# Patient Record
Sex: Male | Born: 1975 | Race: White | Hispanic: No | Marital: Married | Smoking: Current every day smoker
Health system: Southern US, Community
[De-identification: ages and names within clinical notes are randomized; demographics above are authoritative.]

## PROBLEM LIST (undated history)

## (undated) DIAGNOSIS — M40209 Unspecified kyphosis, site unspecified: Secondary | ICD-10-CM

## (undated) DIAGNOSIS — G629 Polyneuropathy, unspecified: Secondary | ICD-10-CM

## (undated) DIAGNOSIS — M419 Scoliosis, unspecified: Secondary | ICD-10-CM

## (undated) HISTORY — PX: EYE SURGERY: SHX253

## (undated) HISTORY — PX: MANDIBLE RECONSTRUCTION: SHX431

---

## 2015-06-25 ENCOUNTER — Ambulatory Visit
Admission: EM | Admit: 2015-06-25 | Discharge: 2015-06-25 | Disposition: A | Discharge status: Home / Self Care | Payer: Medicaid Other | Attending: Family Medicine | Admitting: Family Medicine

## 2015-06-25 ENCOUNTER — Ambulatory Visit: Payer: Medicaid Other

## 2015-06-25 DIAGNOSIS — R51 Headache: Secondary | ICD-10-CM | POA: Diagnosis present

## 2015-06-25 DIAGNOSIS — S61432A Puncture wound without foreign body of left hand, initial encounter: Secondary | ICD-10-CM | POA: Insufficient documentation

## 2015-06-25 DIAGNOSIS — S0990XA Unspecified injury of head, initial encounter: Secondary | ICD-10-CM

## 2015-06-25 DIAGNOSIS — Y29XXXA Contact with blunt object, undetermined intent, initial encounter: Secondary | ICD-10-CM | POA: Insufficient documentation

## 2015-06-25 DIAGNOSIS — F17210 Nicotine dependence, cigarettes, uncomplicated: Secondary | ICD-10-CM | POA: Diagnosis not present

## 2015-06-25 DIAGNOSIS — W294XXA Contact with nail gun, initial encounter: Secondary | ICD-10-CM | POA: Diagnosis not present

## 2015-06-25 DIAGNOSIS — M79642 Pain in left hand: Secondary | ICD-10-CM | POA: Diagnosis present

## 2015-06-25 HISTORY — DX: Unspecified kyphosis, site unspecified: M40.209

## 2015-06-25 HISTORY — DX: Scoliosis, unspecified: M41.9

## 2015-06-25 HISTORY — DX: Polyneuropathy, unspecified: G62.9

## 2015-06-25 MED ORDER — OXYCODONE-ACETAMINOPHEN 5-325 MG PO TABS: 1.0000 | ORAL_TABLET | Freq: Four times a day (QID) | ORAL | Status: DC | PRN

## 2015-06-25 MED ORDER — AMOXICILLIN-POT CLAVULANATE 875-125 MG PO TABS: 1.0000 | ORAL_TABLET | Freq: Two times a day (BID) | ORAL | Status: DC

## 2015-06-25 MED ORDER — TETANUS-DIPHTH-ACELL PERTUSSIS 5-2.5-18.5 LF-MCG/0.5 IM SUSP
0.5000 mL | Freq: Once | INTRAMUSCULAR | Status: AC
  Administered 2015-06-25: 0.5 mL via INTRAMUSCULAR
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
## 2020-06-18 ENCOUNTER — Emergency Department
Admission: EM | Admit: 2020-06-18 | Discharge: 2020-06-23 | Disposition: A | Discharge status: Home / Self Care | Payer: Medicaid Other | Attending: Emergency Medicine | Admitting: Emergency Medicine

## 2020-06-18 ENCOUNTER — Other Ambulatory Visit: Payer: Self-pay

## 2020-06-18 DIAGNOSIS — M79661 Pain in right lower leg: Secondary | ICD-10-CM | POA: Diagnosis not present

## 2020-06-18 DIAGNOSIS — Y929 Unspecified place or not applicable: Secondary | ICD-10-CM | POA: Insufficient documentation

## 2020-06-18 DIAGNOSIS — M79662 Pain in left lower leg: Secondary | ICD-10-CM | POA: Diagnosis not present

## 2020-06-18 DIAGNOSIS — G8929 Other chronic pain: Secondary | ICD-10-CM

## 2020-06-18 DIAGNOSIS — Y939 Activity, unspecified: Secondary | ICD-10-CM | POA: Diagnosis not present

## 2020-06-18 DIAGNOSIS — Z20822 Contact with and (suspected) exposure to covid-19: Secondary | ICD-10-CM | POA: Diagnosis not present

## 2020-06-18 DIAGNOSIS — S14104S Unspecified injury at C4 level of cervical spinal cord, sequela: Secondary | ICD-10-CM | POA: Diagnosis not present

## 2020-06-18 DIAGNOSIS — F17210 Nicotine dependence, cigarettes, uncomplicated: Secondary | ICD-10-CM | POA: Insufficient documentation

## 2020-06-18 DIAGNOSIS — X58XXXA Exposure to other specified factors, initial encounter: Secondary | ICD-10-CM | POA: Diagnosis not present

## 2020-06-18 DIAGNOSIS — Y999 Unspecified external cause status: Secondary | ICD-10-CM | POA: Diagnosis not present

## 2020-06-18 DIAGNOSIS — S14101S Unspecified injury at C1 level of cervical spinal cord, sequela: Secondary | ICD-10-CM

## 2020-06-18 LAB — CBC WITH DIFFERENTIAL/PLATELET
Abs Immature Granulocytes: 0.05 10*3/uL (ref 0.00–0.07)
Basophils Absolute: 0.1 10*3/uL (ref 0.0–0.1)
Basophils Relative: 1 %
Eosinophils Absolute: 0.1 10*3/uL (ref 0.0–0.5)
Eosinophils Relative: 1 %
HCT: 44.7 % (ref 39.0–52.0)
Hemoglobin: 15.1 g/dL (ref 13.0–17.0)
Immature Granulocytes: 1 %
Lymphocytes Relative: 17 %
Lymphs Abs: 1.6 10*3/uL (ref 0.7–4.0)
MCH: 27.3 pg (ref 26.0–34.0)
MCHC: 33.8 g/dL (ref 30.0–36.0)
MCV: 80.7 fL (ref 80.0–100.0)
Monocytes Absolute: 0.8 10*3/uL (ref 0.1–1.0)
Monocytes Relative: 9 %
Neutro Abs: 7.1 10*3/uL (ref 1.7–7.7)
Neutrophils Relative %: 71 %
Platelets: 447 10*3/uL — ABNORMAL HIGH (ref 150–400)
RBC: 5.54 MIL/uL (ref 4.22–5.81)
RDW: 13.7 % (ref 11.5–15.5)
WBC: 9.7 10*3/uL (ref 4.0–10.5)
nRBC: 0 % (ref 0.0–0.2)

## 2020-06-18 LAB — BASIC METABOLIC PANEL
Anion gap: 10 (ref 5–15)
BUN: 9 mg/dL (ref 6–20)
CO2: 29 mmol/L (ref 22–32)
Calcium: 9.1 mg/dL (ref 8.9–10.3)
Chloride: 100 mmol/L (ref 98–111)
Creatinine, Ser: 1.14 mg/dL (ref 0.61–1.24)
GFR calc Af Amer: >60 mL/min (ref >60)
GFR calc non Af Amer: >60 mL/min (ref >60)
Glucose, Bld: 91 mg/dL (ref 70–99)
Potassium: 3.3 mmol/L — ABNORMAL LOW (ref 3.5–5.1)
Sodium: 139 mmol/L (ref 135–145)

## 2020-06-18 MED ORDER — BACLOFEN 10 MG PO TABS
20.0000 mg | ORAL_TABLET | Freq: Three times a day (TID) | ORAL | Status: DC
  Administered 2020-06-18 – 2020-06-23 (×15): 20 mg via ORAL
  Filled 2020-06-18 (×18): qty 2

## 2020-06-18 MED ORDER — GABAPENTIN 300 MG PO CAPS: 300.0000 mg | ORAL_CAPSULE | Freq: Three times a day (TID) | ORAL | 1 refills | Status: DC

## 2020-06-18 MED ORDER — BACLOFEN 20 MG PO TABS: 20.0000 mg | ORAL_TABLET | Freq: Three times a day (TID) | ORAL | 1 refills | Status: DC

## 2020-06-18 MED ORDER — NORTRIPTYLINE HCL 25 MG PO CAPS: 25.0000 mg | ORAL_CAPSULE | Freq: Every day | ORAL | 0 refills | Status: DC

## 2020-06-18 MED ORDER — GABAPENTIN 300 MG PO CAPS
300.0000 mg | ORAL_CAPSULE | Freq: Three times a day (TID) | ORAL | Status: DC
  Administered 2020-06-18 – 2020-06-23 (×15): 300 mg via ORAL
  Filled 2020-06-18 (×15): qty 1

## 2020-06-18 MED ORDER — DIAZEPAM 2 MG PO TABS: 2.0000 mg | ORAL_TABLET | Freq: Two times a day (BID) | ORAL | 0 refills | Status: DC | PRN

## 2020-06-18 MED ORDER — DIAZEPAM 2 MG PO TABS
2.0000 mg | ORAL_TABLET | Freq: Two times a day (BID) | ORAL | Status: DC | PRN
  Administered 2020-06-18 – 2020-06-23 (×10): 2 mg via ORAL
  Filled 2020-06-18 (×12): qty 1

## 2020-06-18 MED ORDER — NORTRIPTYLINE HCL 25 MG PO CAPS
25.0000 mg | ORAL_CAPSULE | Freq: Every day | ORAL | Status: DC
  Administered 2020-06-18 – 2020-06-22 (×5): 25 mg via ORAL
  Filled 2020-06-18 (×6): qty 1
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
## 2020-06-18 NOTE — ED Notes (Signed)
Pt given meal tray. Denies further needs at this time

## 2020-06-19 DIAGNOSIS — S14104S Unspecified injury at C4 level of cervical spinal cord, sequela: Secondary | ICD-10-CM | POA: Diagnosis not present

## 2020-06-19 NOTE — ED Notes (Signed)
Called dietary for tray.
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
## 2020-06-19 NOTE — ED Notes (Signed)
Soc work placement

## 2020-06-19 NOTE — ED Notes (Signed)
Resumed care from Mac, RN. Pt resting comfortably, requested cream and sugar for coffee, which was provided. No other needs expressed at this time. NAD noted.

## 2020-06-19 NOTE — ED Notes (Signed)
Pt requesting food; diet order entered.

## 2020-06-20 DIAGNOSIS — S14104S Unspecified injury at C4 level of cervical spinal cord, sequela: Secondary | ICD-10-CM | POA: Diagnosis not present

## 2020-06-20 LAB — URINE DRUG SCREEN, QUALITATIVE (ARMC ONLY)
Amphetamines, Ur Screen: NOT DETECTED
Barbiturates, Ur Screen: NOT DETECTED
Benzodiazepine, Ur Scrn: POSITIVE — AB
Cannabinoid 50 Ng, Ur ~~LOC~~: NOT DETECTED
Cocaine Metabolite,Ur ~~LOC~~: POSITIVE — AB
MDMA (Ecstasy)Ur Screen: NOT DETECTED
Methadone Scn, Ur: NOT DETECTED
Opiate, Ur Screen: NOT DETECTED
Phencyclidine (PCP) Ur S: NOT DETECTED
Tricyclic, Ur Screen: POSITIVE — AB

## 2020-06-20 LAB — URINALYSIS, COMPLETE (UACMP) WITH MICROSCOPIC
Bacteria, UA: NONE SEEN
Bilirubin Urine: NEGATIVE
Glucose, UA: NEGATIVE mg/dL
Ketones, ur: NEGATIVE mg/dL
Leukocytes,Ua: NEGATIVE
Nitrite: NEGATIVE
Protein, ur: NEGATIVE mg/dL
Specific Gravity, Urine: 1.009 (ref 1.005–1.030)
Squamous Epithelial / LPF: NONE SEEN (ref 0–5)
pH: 6 (ref 5.0–8.0)

## 2020-06-20 MED ORDER — LIDOCAINE VISCOUS HCL 2 % MT SOLN
15.0000 mL | Freq: Once | OROMUCOSAL | Status: AC
  Administered 2020-06-20: 15 mL via ORAL
  Filled 2020-06-20: qty 15

## 2020-06-20 MED ORDER — DIAZEPAM 5 MG PO TABS
5.0000 mg | ORAL_TABLET | Freq: Once | ORAL | Status: AC
  Administered 2020-06-20: 5 mg via ORAL
  Filled 2020-06-20: qty 1

## 2020-06-20 MED ORDER — DICYCLOMINE HCL 10 MG PO CAPS
20.0000 mg | ORAL_CAPSULE | Freq: Once | ORAL | Status: AC
  Administered 2020-06-20: 20 mg via ORAL
  Filled 2020-06-20: qty 2

## 2020-06-20 MED ORDER — NICOTINE 21 MG/24HR TD PT24
21.0000 mg | MEDICATED_PATCH | Freq: Once | TRANSDERMAL | Status: AC
  Administered 2020-06-20: 21 mg via TRANSDERMAL
  Filled 2020-06-20: qty 1

## 2020-06-20 MED ORDER — ALUM & MAG HYDROXIDE-SIMETH 200-200-20 MG/5ML PO SUSP
30.0000 mL | Freq: Once | ORAL | Status: AC
  Administered 2020-06-20: 30 mL via ORAL
  Filled 2020-06-20: qty 30

## 2020-06-20 NOTE — ED Notes (Signed)
Pt placed in hospital bed with no issue. 
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
## 2020-06-20 NOTE — TOC Progression Note (Signed)
Transition of Care [facility]) - Progression Note    Patient Details  Name: Cole Poole MRN: [id number] Date of Birth: May 20, 1976  Transition of Care [facility]) CM/SW Contact  [place] Cellar, RN Phone Number: 06/20/2020, 11:31 AM  Clinical Narrative:    Received call from Jimmie Molly, APS DSS [phone] states she has been long term Child psychotherapist for this patient in outpatient setting. Roni states patient was requesting placement into LTC facility.         Expected Discharge Plan and Services                                                 Social Determinants of Health (SDOH) Interventions    Readmission Risk Interventions No flowsheet data found.

## 2020-06-20 NOTE — TOC Progression Note (Signed)
Transition of Care [facility]) - Progression Note    Patient Details  Name: Cole Poole MRN: [id number] Date of Birth: 06/30/1976  Transition of Care [facility]) CM/SW Contact  [place] Cellar, RN Phone Number: 06/20/2020, 10:46 AM  Clinical Narrative:    Received call from Jimmie Molly, [employer] DSS [phone] requesting updated information.         Expected Discharge Plan and Services                                                 Social Determinants of Health (SDOH) Interventions    Readmission Risk Interventions No flowsheet data found.
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
## 2020-06-20 NOTE — ED Notes (Signed)
VOL  PENDING  PLACEMENT 

## 2020-06-20 NOTE — NC FL2 (Signed)
[place] MEDICAID FL2 LEVEL OF CARE SCREENING TOOL     IDENTIFICATION  Patient Name: Cole Poole Birthdate: 01-Feb-1976 Sex: male Admission Date (Current Location): 06/18/2020  [place] and [place] Number:  Chiropodist and Address:  [facility], [address], [place], [place] [address]      Provider Number: [id number]  Attending Physician Name and Address:  No att. providers found  Relative Name and Phone Number:       Current Level of Care: Hospital Recommended Level of Care: Other (Comment) (Long Term Care Placement) Prior Approval Number:    Date Approved/Denied:   PASRR Number:    Discharge Plan: Other (Comment) (Long Term Care placement)    Current Diagnoses: There are no problems to display for this patient.   Orientation RESPIRATION BLADDER Height & Weight     Self, Time, Situation, Place  Normal   Weight: 93 kg Height:  5\' 11"  (180.3 cm)  BEHAVIORAL SYMPTOMS/MOOD NEUROLOGICAL BOWEL NUTRITION STATUS        Diet  AMBULATORY STATUS COMMUNICATION OF NEEDS Skin   Total Care (Paraplegic) Verbally Normal                       Personal Care Assistance Level of Assistance  Bathing, Dressing, Feeding Bathing Assistance: Limited assistance Feeding assistance: Limited assistance Dressing Assistance: Limited assistance     Functional Limitations Info             SPECIAL CARE FACTORS FREQUENCY                       Contractures      Additional Factors Info                  Current Medications (06/20/2020):  This is the current hospital active medication list Current Facility-Administered Medications  Medication Dose Route Frequency Provider Last Rate Last Admin  . baclofen (LIORESAL) tablet 20 mg  20 mg Oral TID 08/20/2020, MD   20 mg at 06/20/20 0802  . diazepam (VALIUM) tablet 2 mg  2 mg Oral Q12H PRN 08/20/20, MD   2 mg at 06/20/20 0802  . gabapentin (NEURONTIN) capsule 300 mg  300 mg  Oral TID 08/20/20, MD   300 mg at 06/20/20 0802  . nicotine (NICODERM CQ - dosed in mg/24 hours) patch 21 mg  21 mg Transdermal Once 08/20/20, MD   21 mg at 06/20/20 0522  . nortriptyline (PAMELOR) capsule 25 mg  25 mg Oral QHS 08/20/20, MD   25 mg at 06/19/20 2201   Current Outpatient Medications  Medication Sig Dispense Refill  . amoxicillin-clavulanate (AUGMENTIN) 875-125 MG per tablet Take 1 tablet by mouth 2 (two) times daily. 20 tablet 0  . baclofen (LIORESAL) 20 MG tablet Take 1 tablet (20 mg total) by mouth 3 (three) times daily. 30 each 1  . citalopram (CELEXA) 40 MG tablet Take 40 mg by mouth daily.    . diazepam (VALIUM) 2 MG tablet Take 1 tablet (2 mg total) by mouth every 12 (twelve) hours as needed for anxiety. 12 tablet 0  . gabapentin (NEURONTIN) 300 MG capsule Take 1 capsule (300 mg total) by mouth 3 (three) times daily. 90 capsule 1  . lamoTRIgine (LAMICTAL) 25 MG tablet Take 25 mg by mouth 2 (two) times daily.    . methocarbamol (ROBAXIN) 750 MG tablet Take 750 mg by mouth 2 (two)  times daily.    . nortriptyline (PAMELOR) 25 MG capsule Take 1 capsule (25 mg total) by mouth at bedtime. 30 capsule 0  . oxyCODONE-acetaminophen (ROXICET) 5-325 MG per tablet Take 1 tablet by mouth every 6 (six) hours as needed for moderate pain or severe pain (1 tablet every 6-8 hours as needed for pain. Do not drive or operate heavy machinery while taking as can cause drowsiness.). 6 tablet 0     Discharge Medications: Please see discharge summary for a list of discharge medications.  Relevant Imaging Results:  Relevant Lab Results:   Additional Information [id number]  Beaulieu Cellar, RN

## 2020-06-20 NOTE — TOC Progression Note (Addendum)
Transition of Care [facility]) - Progression Note    Patient Details  Name: Cole Poole MRN: [id number] Date of Birth: 1976/07/18  Transition of Care [facility]) CM/SW Contact  [place] Cellar, RN Phone Number: 06/20/2020, 3:29 PM  Clinical Narrative:    Spoke to patient at bedside. Confirmed patient is requesting placement into LTC facility due to unsafe living situation. Updated patient Fl2 had been sent out and would update patient with bed offers. Updated patient that RN CM had spoken with Roni Luther-APS SW and updated on hospitalization as well.   Patient with bed offer from [facility]. LVMM for Revonda Standard @ [phone] requesting callback with needed information for discharge to facility.   4:18pm: Accepted bed offer to [facility]. Revonda Standard states she will review financial information and update tomorrow.        Expected Discharge Plan and Services                                                 Social Determinants of Health (SDOH) Interventions    Readmission Risk Interventions No flowsheet data found.

## 2020-06-20 NOTE — ED Notes (Signed)
Pt c/o upper/mid abdominal pressure. MD notified

## 2020-06-21 DIAGNOSIS — S14104S Unspecified injury at C4 level of cervical spinal cord, sequela: Secondary | ICD-10-CM | POA: Diagnosis not present

## 2020-06-21 LAB — SARS CORONAVIRUS 2 BY RT PCR (HOSPITAL ORDER, PERFORMED IN ~~LOC~~ HOSPITAL LAB): SARS Coronavirus 2: NEGATIVE

## 2020-06-21 NOTE — TOC Progression Note (Addendum)
Transition of Care [facility]) - Progression Note    Patient Details  Name: Cole Poole MRN: [id number] Date of Birth: 11-Feb-1976  Transition of Care [facility]) CM/SW Contact  [place] Cellar, RN Phone Number: 06/21/2020, 11:49 AM  Clinical Narrative:     Planning for transfer to [facility]. Requested COVID test for discharge.   Spoke to Zavalla @ SNF-SNF is working on completing financial review.  3:00pm: Updated Revonda Standard @ SNF-COVID negative. Revonda Standard states she continues to be waiting on SS confirmation regarding financial information.   3:16pm: Updated Jimmie Molly, APS DSS  Of anticipated transfer 9/10 to [facility].        Expected Discharge Plan and Services                                                 Social Determinants of Health (SDOH) Interventions    Readmission Risk Interventions No flowsheet data found.

## 2020-06-22 DIAGNOSIS — S14104S Unspecified injury at C4 level of cervical spinal cord, sequela: Secondary | ICD-10-CM | POA: Diagnosis not present

## 2020-06-22 MED ORDER — TRAZODONE HCL 100 MG PO TABS
100.0000 mg | ORAL_TABLET | Freq: Every day | ORAL | Status: DC
  Administered 2020-06-22 (×2): 100 mg via ORAL
  Filled 2020-06-22 (×2): qty 1

## 2020-06-22 NOTE — ED Notes (Signed)
Pt visualized in NAD at this time, resting in bed, pt currently eating dinner at this time.

## 2020-06-22 NOTE — ED Notes (Signed)
Pt given sprite and crackers and peanut butter per request.

## 2020-06-22 NOTE — ED Notes (Signed)
Meal given at this time

## 2020-06-22 NOTE — ED Notes (Signed)
Repeat VS obtained by this RN. Pt given meal tray at this time.

## 2020-06-22 NOTE — Progress Notes (Signed)
Chaplain received a phone call from Pt's friend, Kendal Hymen Cochrane asking to speak with Pt. Friend indicated Pt would be transferred to another location and would need his belongings.  Chaplain consulted with RN about the Friends' request. Chaplain spoke with Pt about friend, Kendal Hymen and her request. Pt chose to call Friend.  Chaplain offered to support Pt in other ways. Pt declined at the time. Chaplain remains available to Pt by way of page or call through the evening.  Clenton Pare, MDiv Staff Chaplain- Relief

## 2020-06-22 NOTE — ED Notes (Signed)
Pt sitting up in bed, requesting his valium at this time.

## 2020-06-22 NOTE — ED Notes (Signed)
Pt visualized resting in bed, VS obtained by this RN. Pt provided with meal tray and a coke at this time.

## 2020-06-22 NOTE — ED Notes (Signed)
Pt resting in bed with eyes closed, respirations even and unlabored at this time. °

## 2020-06-22 NOTE — TOC Progression Note (Addendum)
Transition of Care [facility]) - Progression Note    Patient Details  Name: DRAY DENTE MRN: [id number] Date of Birth: Dec 25, 1975  Transition of Care [facility]) CM/SW Contact  [place] Cellar, RN Phone Number: 06/22/2020, 9:37 AM  Clinical Narrative:    Sherron Monday to Revonda Standard @ [facility] facility is still waiting on financial verification however is anticipating final decision today. Patient will transfer to SNF once financial information confirmed at facility.  3:30pm: Updated by Revonda Standard @ [facility] are still waiting on financial check regarding disability check. Anticipate it will be Monday before patient is able to transfer.  No other bed offers at this time.         Expected Discharge Plan and Services                                                 Social Determinants of Health (SDOH) Interventions    Readmission Risk Interventions No flowsheet data found.

## 2020-06-22 NOTE — ED Notes (Signed)
Pt given coke and graham crackers per his request. Updated regarding wait and placement by this RN. Pt states understanding. Denies any further needs needs at this time.

## 2020-06-22 NOTE — ED Notes (Signed)
This RN to bedside at this time. Introduced self to pt. Pt visualized to be laying in bed with eyes closed with no distress noted. Pt denies further needs at this time.

## 2020-06-22 NOTE — ED Notes (Signed)
Pt assisted on and off bedpan by this RN. Pt had large bowel movement and yellow urine. Pt cleaned and dried by this RN, fresh bed pad placed under patient. Soiled clothes removed from pt and placed in labeled belongings bag.  Warm blanket given.

## 2020-06-22 NOTE — ED Notes (Signed)
NAD noted at this time. Pt resting in bed with eyes closed, respirations even and unlabored, pt repositioned himself in bed at this time.

## 2020-06-23 DIAGNOSIS — S14104S Unspecified injury at C4 level of cervical spinal cord, sequela: Secondary | ICD-10-CM | POA: Diagnosis not present

## 2020-06-23 MED ORDER — GABAPENTIN 300 MG PO CAPS: 300.0000 mg | ORAL_CAPSULE | Freq: Three times a day (TID) | ORAL | 1 refills | Status: AC

## 2020-06-23 MED ORDER — NORTRIPTYLINE HCL 25 MG PO CAPS: 25.0000 mg | ORAL_CAPSULE | Freq: Every day | ORAL | 0 refills | Status: DC

## 2020-06-23 MED ORDER — DIAZEPAM 2 MG PO TABS: 2.0000 mg | ORAL_TABLET | Freq: Two times a day (BID) | ORAL | 0 refills | Status: AC | PRN

## 2020-06-23 MED ORDER — DIAZEPAM 2 MG PO TABS: 2.0000 mg | ORAL_TABLET | Freq: Two times a day (BID) | ORAL | 0 refills | Status: DC | PRN

## 2020-06-23 MED ORDER — BACLOFEN 20 MG PO TABS: 20.0000 mg | ORAL_TABLET | Freq: Three times a day (TID) | ORAL | 1 refills | Status: AC

## 2020-06-23 MED ORDER — BACLOFEN 20 MG PO TABS: 20.0000 mg | ORAL_TABLET | Freq: Three times a day (TID) | ORAL | 1 refills | Status: DC

## 2020-06-23 MED ORDER — GABAPENTIN 300 MG PO CAPS: 300.0000 mg | ORAL_CAPSULE | Freq: Three times a day (TID) | ORAL | 1 refills | Status: DC

## 2020-06-23 NOTE — TOC Progression Note (Signed)
Transition of Care [facility]) - Progression Note    Patient Details  Name: DHILLON COMUNALE MRN: [id number] Date of Birth: 05-26-1976  Transition of Care [facility]) CM/SW Contact  Larwance Rote, LCSW Phone Number: 06/23/2020, 2:00 PM  Clinical Narrative:   Patient is cleared to discharge to [facility] [place] -  Today 06/23/2020.    Barriers: Waiting for room number.       Expected Discharge Plan and Services           Social Determinants of Health (SDOH) Interventions   Readmission Risk Interventions No flowsheet data found.

## 2020-06-23 NOTE — ED Notes (Signed)
Pt given lunch tray.

## 2020-06-23 NOTE — ED Notes (Signed)
Pt given crackers upon request.

## 2020-06-23 NOTE — TOC Progression Note (Addendum)
Transition of Care [facility]) - Progression Note    Patient Details  Name: Cole Poole MRN: [id number] Date of Birth: 1976/06/19  Transition of Care [facility]) CM/SW Contact  Tenecia Ignasiak, Johnnette Litter, [place] Phone Number: 06/23/2020, 3:01 PM  Clinical Narrative:  [facility] team provided clothing for transport to [facility] room 205B. Pt. Is and verbally responsive. [phone].          Expected Discharge Plan and Services                                                 Social Determinants of Health (SDOH) Interventions    Readmission Risk Interventions No flowsheet data found.

## 2020-06-23 NOTE — ED Notes (Signed)
Pt prepped for transport to [facility] in [place], [place] (SNF). Is aware cigarettes are not permitted to go with him. Pt making phone call to family/friends to let them know where he is going. Paperwork ready for transport crew. Awaiting their arrival.

## 2020-06-23 NOTE — TOC Progression Note (Signed)
Transition of Care [facility]) - Progression Note    Patient Details  Name: Cole Poole MRN: [id number] Date of Birth: [birth year]  Transition of Care [facility]) CM/SW Contact  Larwance Rote, LCSW Phone Number: 06/23/2020, 8:24 AM  Clinical Narrative:  Expected discharge to [facility].   Patient has been accepted at  [facility].   Barriers: Waiting on financial check from SNF. Social waiting for call back [facility]. 06/23/2020 0837  Expected Discharge Plan and Services       Social Determinants of Health (SDOH) Interventions   Readmission Risk Interventions No flowsheet data found.

## 2020-06-23 NOTE — ED Notes (Signed)
Pt resting quietly at this time, no distress noted, cont to monitor 

## 2020-06-23 NOTE — ED Notes (Signed)
Pt noted to be resting in bed with mask on in hallway. emptied from urinal.

## 2020-06-23 NOTE — ED Notes (Signed)
Pt was given a cup of vanilla and chocolate ice cream cups.

## 2020-06-23 NOTE — ED Notes (Signed)
Pt given graham crackers and coke. No further needs at this time.

## 2020-06-23 NOTE — Social Work (Signed)
TOC C/M Child psychotherapist process case with [facility]. Shared concerns and barriers to discharge.

## 2020-06-23 NOTE — ED Notes (Signed)
Social worker spoke with this RN and stated that she was notified that the patient has a bed at the [place] center and that insurance verification was being processed.

## 2021-12-03 ENCOUNTER — Other Ambulatory Visit: Payer: Self-pay

## 2021-12-03 ENCOUNTER — Ambulatory Visit
Admission: EM | Admit: 2021-12-03 | Discharge: 2021-12-03 | Disposition: A | Discharge status: Home / Self Care | Payer: Medicaid Other | Attending: Emergency Medicine | Admitting: Emergency Medicine

## 2021-12-03 DIAGNOSIS — J069 Acute upper respiratory infection, unspecified: Secondary | ICD-10-CM | POA: Diagnosis not present

## 2021-12-03 MED ORDER — PROMETHAZINE-PHENYLEPHRINE 6.25-5 MG/5ML PO SYRP: 5.0000 mL | ORAL_SOLUTION | Freq: Four times a day (QID) | ORAL | 0 refills | Status: AC | PRN

## 2021-12-03 MED ORDER — IPRATROPIUM BROMIDE 0.06 % NA SOLN: 2.0000 | Freq: Four times a day (QID) | NASAL | 12 refills | Status: AC

## 2021-12-03 MED ORDER — BENZONATATE 100 MG PO CAPS: 200.0000 mg | ORAL_CAPSULE | Freq: Three times a day (TID) | ORAL | 0 refills | Status: AC

## 2021-12-03 NOTE — Discharge Instructions (Signed)
Use the Atrovent nasal spray, 2 squirts in each nostril every 6 hours, as needed for runny nose and postnasal drip.  Use the Tessalon Perles every 8 hours during the day.  Take them with a small sip of water.  They may give you some numbness to the base of your tongue or a metallic taste in your mouth, this is normal.  Use the Promethazine VC cough syrup at bedtime for cough and congestion.  It will make you drowsy so do not take it during the day.  Return for reevaluation or see your primary care provider for any new or worsening symptoms.

## 2021-12-03 NOTE — ED Triage Notes (Signed)
Pt c/o congestion x5days.  Pt was around a friend who had bronchitis.

## 2021-12-03 NOTE — ED Provider Notes (Signed)
Patient [facility]    CSN: [id number] Arrival date & time: 12/03/21  1947      History   Chief Complaint Chief Complaint  Patient presents with   Cough    HPI Cole Poole is a 46 y.o. male.   HPI  46 year old male here for evaluation of respiratory complaints.  Orts that for last 5 days he has been experiencing nasal congestion with green nasal discharge and chest congestion with a productive cough for green sputum.  He denies fever, sore throat, ear pain, shortness of breath, or wheezing.  He states he was around someone with bronchitis and is concerned that he may have contracted that.  Past Medical History:  Diagnosis Date   Kyphosis    Neuropathy    Scoliosis     There are no problems to display for this patient.   Past Surgical History:  Procedure Laterality Date   EYE SURGERY Left    MANDIBLE RECONSTRUCTION         Home Medications    Prior to Admission medications   Medication Sig Start Date End Date Taking? Authorizing Provider  baclofen (LIORESAL) 20 MG tablet Take 1 tablet (20 mg total) by mouth 3 (three) times daily. 06/23/20  Yes Sharman Cheek, MD  benzonatate (TESSALON) 100 MG capsule Take 2 capsules (200 mg total) by mouth every 8 (eight) hours. 12/03/21  Yes Becky Augusta, NP  gabapentin (NEURONTIN) 300 MG capsule Take 1 capsule (300 mg total) by mouth 3 (three) times daily. 06/23/20 08/22/20  Sharman Cheek, MD  ipratropium (ATROVENT) 0.06 % nasal spray Place 2 sprays into both nostrils 4 (four) times daily. 12/03/21  Yes Becky Augusta, NP  promethazine-phenylephrine (PROMETHAZINE VC) 6.25-5 MG/5ML SYRP Take 5 mLs by mouth every 6 (six) hours as needed for congestion. 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  HENT:  Positive for congestion and rhinorrhea. Negative for ear pain and sore throat.   Respiratory:  Positive for cough. Negative for shortness of breath and wheezing.   Hematological: Negative.   Psychiatric/Behavioral: Negative.      Physical Exam Triage Vital Signs ED Triage Vitals  Enc Vitals Group     BP      Pulse      Resp      Temp      Temp src      SpO2      Weight      Height      Head Circumference      Peak Flow      Pain Score      Pain Loc      Pain Edu?      Excl. in GC?    No data found.  Updated Vital Signs BP (!) 149/80 (BP Location: Right Arm)    Pulse 75  Temp 97.9 F (36.6 C) (Oral)    Resp 18    Ht 5\' 11"  (1.803 m)    Wt 190 lb (86.2 kg)    SpO2 92%    BMI 26.50 kg/m   Visual Acuity Right Eye Distance:   Left Eye Distance:   Bilateral Distance:    Right Eye Near:   Left Eye Near:    Bilateral Near:     Physical Exam Vitals and nursing note reviewed.  Constitutional:      General: He is not in acute distress.    Appearance: Normal appearance. He is not ill-appearing.  HENT:     Head: Normocephalic and atraumatic.     Right Ear: Tympanic membrane, ear canal and external ear normal. There is no impacted cerumen.     Left Ear: Tympanic membrane, ear canal and external ear normal. There is no impacted cerumen.     Nose: Congestion and rhinorrhea present.     Mouth/Throat:     Mouth: Mucous membranes are moist.     Pharynx: Oropharynx is clear. No posterior oropharyngeal erythema.  Cardiovascular:     Rate and Rhythm: Normal rate and regular rhythm.     Pulses: Normal pulses.     Heart sounds: Normal heart sounds. No murmur heard.   No  friction rub. No gallop.  Pulmonary:     Effort: Pulmonary effort is normal.     Breath sounds: Normal breath sounds. No wheezing, rhonchi or rales.  Musculoskeletal:     Cervical back: Normal range of motion and neck supple.  Lymphadenopathy:     Cervical: No cervical adenopathy.  Skin:    General: Skin is warm and dry.     Capillary Refill: Capillary refill takes less than 2 seconds.     Findings: No erythema or rash.  Neurological:     General: No focal deficit present.     Mental Status: He is alert and oriented to person, place, and time.  Psychiatric:        Mood and Affect: Mood normal.        Behavior: Behavior normal.        Thought Content: Thought content normal.        Judgment: Judgment normal.     UC Treatments / Results  Labs (all labs ordered are listed, but only abnormal results are displayed) Labs Reviewed - No data to display  EKG   Radiology No results found.  Procedures Procedures (including critical care time)  Medications Ordered in UC Medications - No data to display  Initial Impression / Assessment and Plan / UC Course  I have reviewed the triage vital signs and the nursing notes.  Pertinent labs & imaging results that were available during my care of the patient were reviewed by me and considered in my medical decision making (see chart for details).  Patient is a nontoxic-appearing 46 year old male here for evaluation of respiratory complaints as outlined HPI above.  Patient's physical exam reveals pearly gray tympanic membranes bilaterally with normal light reflex and clear external auditory canals.  Nasal mucosa is erythematous and edematous with clear discharge in both nares.  Oropharyngeal exam is benign.  No cervical lymphadenopathy appreciated exam.  Cardiopulmonary exam reveals clear lung sounds all fields.  Patient exam is consistent with a viral URI with cough.  We will treat him with Atrovent nasal spray, Tessalon Perles, and  Promethazine VC cough syrup.  Return precautions reviewed with patient.   Final Clinical Impressions(s) /  UC Diagnoses   Final diagnoses:  Viral URI with cough     Discharge Instructions      Use the Atrovent nasal spray, 2 squirts in each nostril every 6 hours, as needed for runny nose and postnasal drip.  Use the Tessalon Perles every 8 hours during the day.  Take them with a small sip of water.  They may give you some numbness to the base of your tongue or a metallic taste in your mouth, this is normal.  Use the Promethazine VC cough syrup at bedtime for cough and congestion.  It will make you drowsy so do not take it during the day.  Return for reevaluation or see your primary care provider for any new or worsening symptoms.      ED Prescriptions     Medication Sig Dispense Auth. Provider   benzonatate (TESSALON) 100 MG capsule Take 2 capsules (200 mg total) by mouth every 8 (eight) hours. 21 capsule Becky Augusta, NP   ipratropium (ATROVENT) 0.06 % nasal spray Place 2 sprays into both nostrils 4 (four) times daily. 15 mL Becky Augusta, NP   promethazine-phenylephrine (PROMETHAZINE VC) 6.25-5 MG/5ML SYRP Take 5 mLs by mouth every 6 (six) hours as needed for congestion. 180 mL Becky Augusta, NP      PDMP not reviewed this encounter.   Becky Augusta, NP 12/03/21 2006

## 2022-02-05 ENCOUNTER — Ambulatory Visit: Payer: Medicaid Other | Admitting: Family Medicine
